# Patient Record
Sex: Male | Born: 1965 | Race: Black or African American | Hispanic: No | Marital: Single | Smoking: Current every day smoker
Health system: Southern US, Community
[De-identification: ages and names within clinical notes are randomized; demographics above are authoritative.]

---

## 2014-08-18 ENCOUNTER — Emergency Department (HOSPITAL_COMMUNITY)
Admission: EM | Admit: 2014-08-18 | Discharge: 2014-08-18 | Disposition: A | Discharge status: Home / Self Care | Payer: BC Managed Care – PPO | Attending: Emergency Medicine | Admitting: Emergency Medicine

## 2014-08-18 ENCOUNTER — Encounter (HOSPITAL_COMMUNITY): Payer: Self-pay | Admitting: Emergency Medicine

## 2014-08-18 ENCOUNTER — Emergency Department (HOSPITAL_COMMUNITY): Payer: BC Managed Care – PPO

## 2014-08-18 DIAGNOSIS — Y92410 Unspecified street and highway as the place of occurrence of the external cause: Secondary | ICD-10-CM | POA: Insufficient documentation

## 2014-08-18 DIAGNOSIS — M545 Low back pain, unspecified: Secondary | ICD-10-CM

## 2014-08-18 DIAGNOSIS — Z720 Tobacco use: Secondary | ICD-10-CM | POA: Diagnosis not present

## 2014-08-18 DIAGNOSIS — M542 Cervicalgia: Secondary | ICD-10-CM

## 2014-08-18 DIAGNOSIS — Y9389 Activity, other specified: Secondary | ICD-10-CM | POA: Diagnosis not present

## 2014-08-18 DIAGNOSIS — S199XXA Unspecified injury of neck, initial encounter: Secondary | ICD-10-CM | POA: Diagnosis present

## 2014-08-18 DIAGNOSIS — S3992XA Unspecified injury of lower back, initial encounter: Secondary | ICD-10-CM | POA: Insufficient documentation

## 2014-08-18 DIAGNOSIS — R51 Headache: Secondary | ICD-10-CM

## 2014-08-18 DIAGNOSIS — R519 Headache, unspecified: Secondary | ICD-10-CM

## 2014-08-18 MED ORDER — CYCLOBENZAPRINE HCL 10 MG PO TABS: 10.0000 mg | ORAL_TABLET | Freq: Two times a day (BID) | ORAL | Status: AC | PRN
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
## 2014-08-18 NOTE — Discharge Instructions (Signed)
Return to the emergency room with worsening of symptoms, new symptoms or with symptoms that are concerning, especially severe worsening of headache, visual or speech changes, weakness in face, arms or legs OR fevers, loss of control of bladder or bowels, numbness or tingling around genital region or anus, weakness. RICE: Rest, Ice (three cycles of 20 mins on, 20mins off at least twice a day), compression/brace, elevation. Heating pad works well for back pain. Ibuprofen 400mg  (2 tablets 200mg ) every 5-6 hours for 3-5 days and then as needed for pain. Muscle relaxer for severe pain. Do not operate machinery, drive or drink alcohol while taking narcotics or muscle relaxers. Follow up with PCP/orthopedist if symptoms worsen or are persistent.

## 2014-08-19 NOTE — ED Provider Notes (Signed)
Medical screening examination/treatment/procedure(s) were performed by non-physician practitioner and as supervising physician I was immediately available for consultation/collaboration.   EKG Interpretation None        Krysten Veronica, MD 08/19/14 0008 
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
